# Patient Record
Sex: Male | Born: 1985 | Race: White | Hispanic: No | Marital: Single | Smoking: Never smoker
Health system: Southern US, Community
[De-identification: ages and names within clinical notes are randomized; demographics above are authoritative.]

---

## 2009-07-27 ENCOUNTER — Emergency Department (HOSPITAL_COMMUNITY): Admission: EM | Admit: 2009-07-27 | Discharge: 2009-07-27 | Payer: Self-pay | Admitting: Emergency Medicine

## 2010-03-04 IMAGING — CR DG TIBIA/FIBULA 2V*L*
4 series · 4 of 4 positions shown · non-contrast
Comparison: None available.

CLINICAL DATA: Penetrating trauma.  Laceration.

LEFT TIBIA AND FIBULA - 2 VIEW

[t tib/fib ap left (1 of 2)]
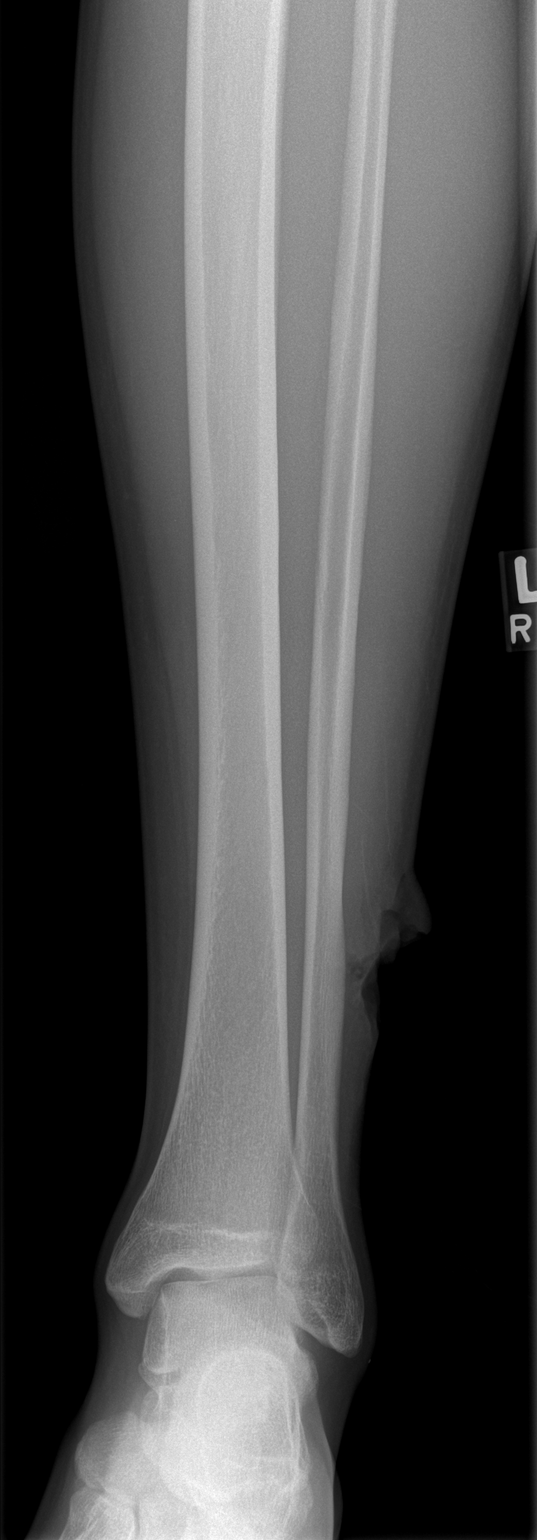

[t tib/fib ap left (2 of 2)]
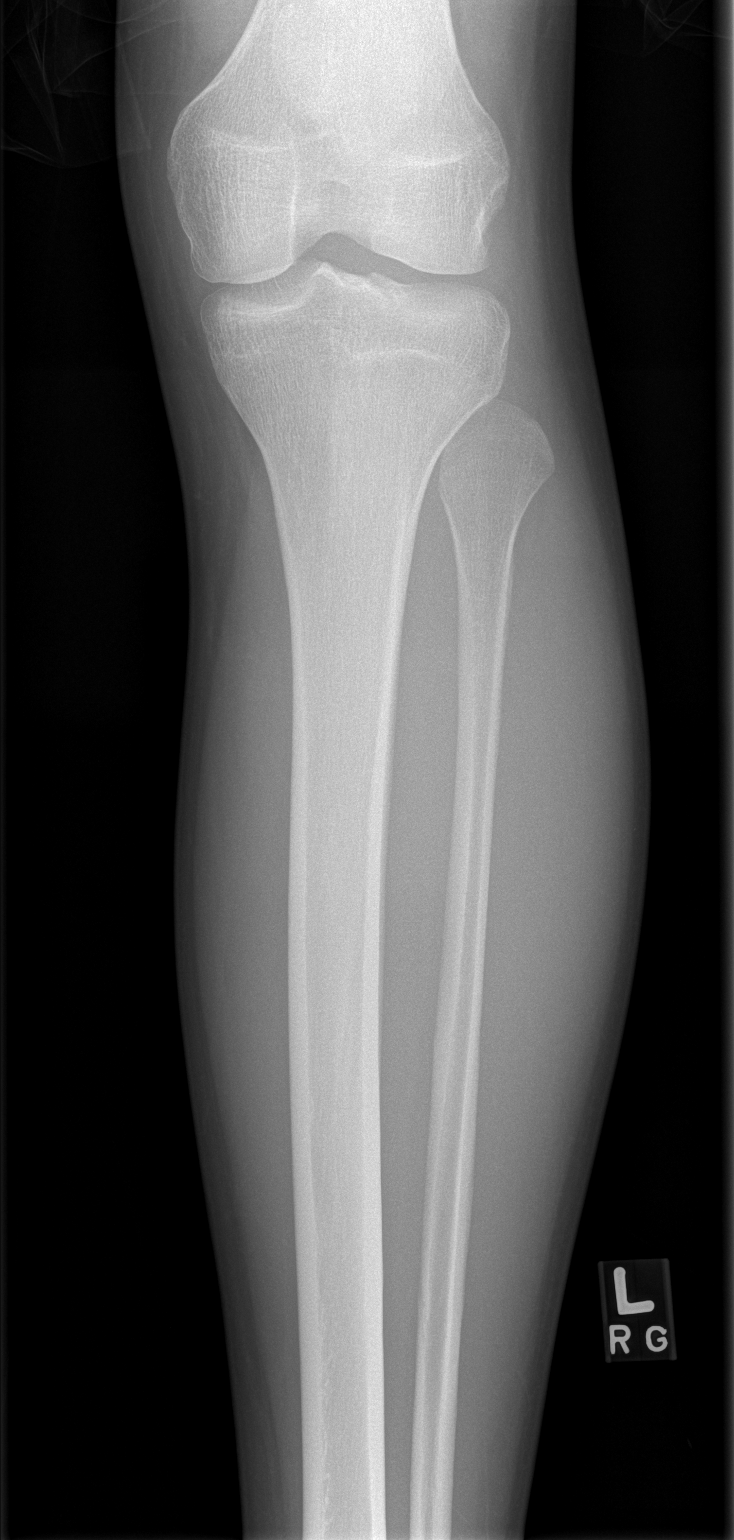

[t tib/fib lat left (1 of 2)]
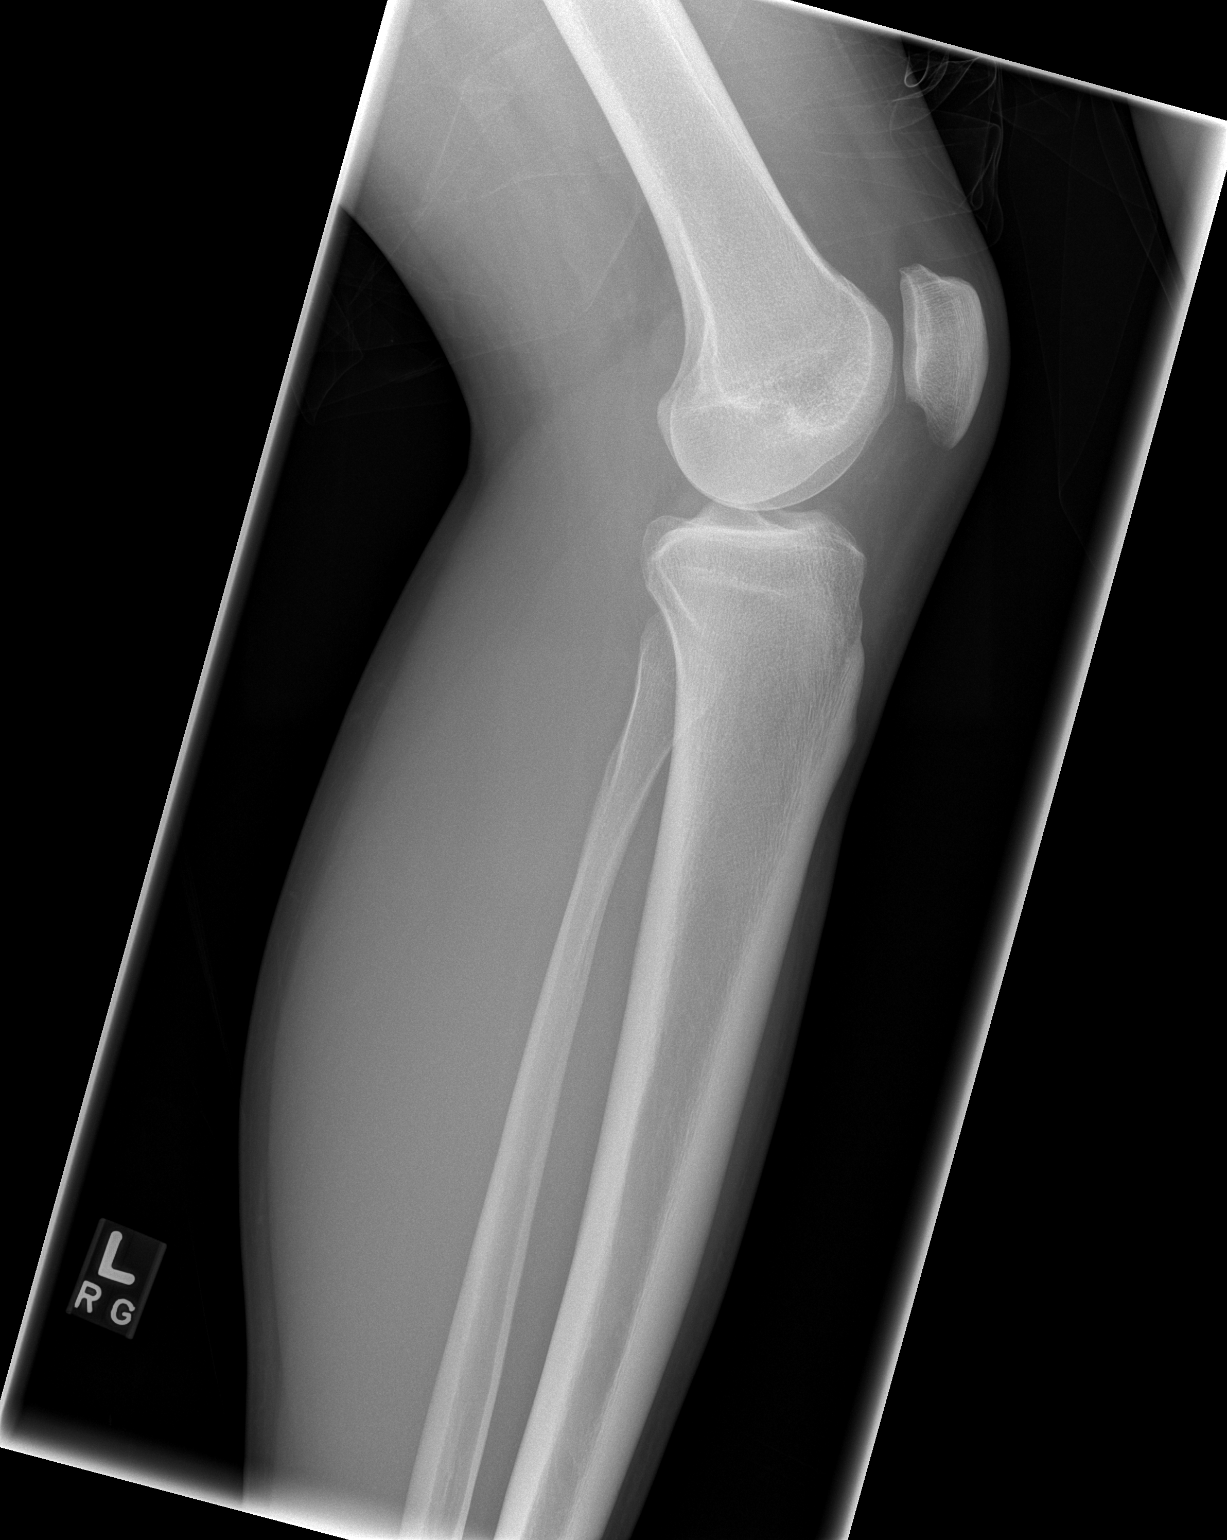

[t tib/fib lat left (2 of 2)]
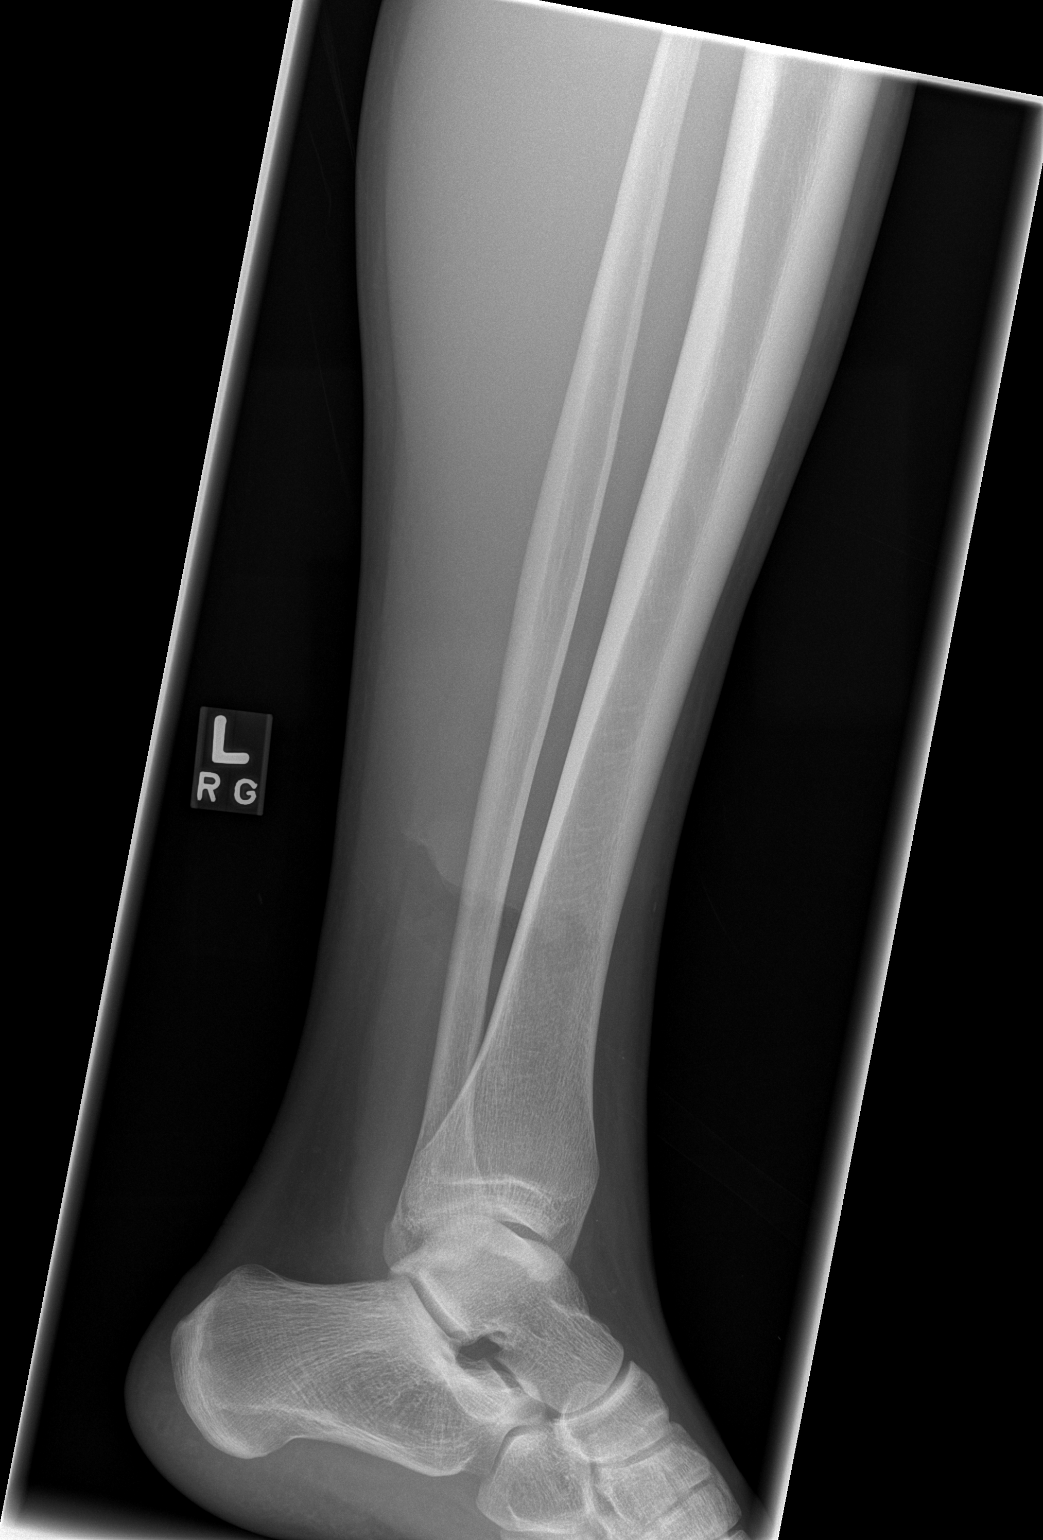

[4 of 4 positions shown; findings below may reference images not displayed]

FINDINGS: There is a soft tissue defect in the distal lateral
aspect of the left leg.  No fracture is identified.  No radiopaque
foreign body is seen.  The soft tissue defect extends deeply and
may reach the fibular cortical surface.  Otherwise exam is
unremarkable.
IMPRESSION: Laceration and soft tissue defect in the lateral distal leg.  No
fracture identified.

## 2011-11-27 ENCOUNTER — Ambulatory Visit (INDEPENDENT_AMBULATORY_CARE_PROVIDER_SITE_OTHER): Payer: No Typology Code available for payment source

## 2011-11-27 DIAGNOSIS — J209 Acute bronchitis, unspecified: Secondary | ICD-10-CM

## 2012-02-16 ENCOUNTER — Ambulatory Visit (HOSPITAL_COMMUNITY): Payer: Self-pay | Admitting: Psychiatry

## 2012-04-27 ENCOUNTER — Encounter (HOSPITAL_COMMUNITY): Payer: Self-pay | Admitting: Physician Assistant

## 2012-04-27 ENCOUNTER — Ambulatory Visit (INDEPENDENT_AMBULATORY_CARE_PROVIDER_SITE_OTHER): Payer: No Typology Code available for payment source | Admitting: Physician Assistant

## 2012-04-27 DIAGNOSIS — F330 Major depressive disorder, recurrent, mild: Secondary | ICD-10-CM | POA: Insufficient documentation

## 2012-04-27 NOTE — Progress Notes (Signed)
Psychiatric Assessment Adult  Patient Identification:  Joshua Oconnor Date of Evaluation:  04/27/2012 Chief Complaint: Depression and insomnia History of Chief Complaint:   Chief Complaint  Patient presents with  . Depression  . Establish Care    HPI Joshua Oconnor presents today for his initial evaluation and subsequent treatment of his depression and extreme sleep difficulties. He reports that this is a long-standing problem, that is causing problems for him due to his busy schedule of work and school.  Joshua Oconnor explains that his depression stems from his childhood when he developed low self-esteem and a poor self image. He continues to have frequent negative self talk, feels an adequate, and that his life has been a failure. He denies any suicidal or homicidal ideation. He denies any auditory or visual hallucinations, or paranoid delusions.  His sleep pattern is as thus: He works until 11:00 most nights, then when he gets home he is unable to unwind, and has a busy mind. He does homework or chores until he is able to go to sleep at 3 or 4 AM. The next morning he will often sleep through important events such as a class because he is unable to wake up. This problem is causing him to fail classes and to be put on academic notice at school. This then further supports his feelings of inadequacy.  He denies anxiety, but does endorse living and isolated life. He denies any period of time with a decreased need for sleep, or increased in mood or energy. He denies any impulsivity. He does endorse easy distractibility, trouble finishing projects, and procrastination. All they do he denies that he loses items or is a disorganized person, he reports others would disagree on that point. He denies any feelings of restlessness or fidgetiness. Review of Systems Physical Exam Joshua Oconnor is a well-nourished well-developed white male who presents as fully alert and oriented and in no acute distress. He is able to ambulate without  assistance, and appears superficially to be healthy and appropriate for his stated age of 76.  Depressive Symptoms: depressed mood, anhedonia, psychomotor agitation, feelings of worthlessness/guilt, hopelessness, disturbed sleep,  (Hypo) Manic Symptoms:   Elevated Mood:  No Irritable Mood:  No Grandiosity:  No Distractibility:  Yes Labiality of Mood:  No Delusions:  No Hallucinations:  No Impulsivity:  No Sexually Inappropriate Behavior:  No Financial Extravagance:  No Flight of Ideas:  No  Anxiety Symptoms: Excessive Worry:  No Panic Symptoms:  No Agoraphobia:  No Obsessive Compulsive: No  Symptoms: None, Specific Phobias:  No Social Anxiety:  No  Psychotic Symptoms:  Hallucinations: No None Delusions:  No Paranoia:  No   Ideas of Reference:  No  PTSD Symptoms: Ever had a traumatic exposure:  No Had a traumatic exposure in the last month:  No Re-experiencing: No None Hypervigilance:  No Hyperarousal: No None Avoidance: No None  Traumatic Brain Injury: No   Past Psychiatric History: Diagnosis: Depression   Hospitalizations: None   Outpatient Care: Previously with Dr. Toni Arthurs   Substance Abuse Care: None   Self-Mutilation: None   Suicidal Attempts: None   Violent Behaviors: None    Past Medical History:   Past Medical History  Diagnosis Date  . Asthma     Has not had in years   History of Loss of Consciousness:  No Seizure History:  No Cardiac History:  No Allergies:  No Known Allergies Current Medications:  No current outpatient prescriptions on file.    Previous Psychotropic Medications:  Medication  Dose   Paxil     Wellbutrin     Ambien    trazodone              Substance Abuse History in the last 12 months: No history of abuse in the past 12 months. Patient reports that he drinks 2-3 drinks once or twice monthly   Social History: Javyon was born in [place]. His father was in the [employer], so his family moved quite a bit while he was  growing up. He reports that he had a happy childhood. He states that his parents were conservative Christians, and he was rather sheltered. He denies suffering any abuse. 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 He currently lives alone. He has a girlfriend of 14 months. He reports that he is spiritual, but affiliates with no religion. He denies any legal difficulties. He reports that his social support system consists of his girlfriend and his parents, as well as friends who are coworkers.  Family History:   Family History  Problem Relation Age of Onset  . Depression Mother     Mental Status Examination/Evaluation: Objective:  Appearance: Casual and Well Groomed  Eye Contact::  Good  Speech:  Clear and Coherent  Volume:  Normal  Mood:  Mildly anxious and dysphoric   Affect:  Congruent  Thought Process:  Goal Directed and Linear  Orientation:  Full  Thought Content:  WDL  Suicidal Thoughts:  No  Homicidal Thoughts:  No  Judgement:  Good  Insight:  Good  Psychomotor Activity:  Normal  Akathisia:  No  Handed:  Right  AIMS (if indicated):    Assets:  Communication Skills Desire for Improvement Physical Health Resilience Transportation Vocational/Educational    Laboratory/X-Ray Psychological Evaluation(s)        Assessment:    AXIS I Major Depression, Recurrent mild, rule out ADHD   AXIS II Deferred  AXIS III Past Medical History  Diagnosis Date  . Asthma     Has not had in years     AXIS IV educational problems and occupational problems  AXIS V 51-60 moderate symptoms   Treatment Plan/Recommendations:  Plan of Care: We will first try her Remeron to treat his insomnia and depression. If significant improvement is not made we will consider a stimulant medication for ADHD.   Laboratory:    Psychotherapy: Will refer to [facility]   Medications: Remeron 7.5-15 mg at bedtime   Routine PRN Medications:   No  Consultations:   Safety Concerns:  None   Other:      Cheyan Frees, PA-C 5/14/20135:08 PM

## 2012-04-28 ENCOUNTER — Telehealth (HOSPITAL_COMMUNITY): Payer: Self-pay | Admitting: *Deleted

## 2012-04-28 MED ORDER — MIRTAZAPINE 15 MG PO TBDP: 15.0000 mg | ORAL_TABLET | Freq: Every day | ORAL | Status: DC

## 2012-04-28 NOTE — Telephone Encounter (Signed)
Left [id number] RX for med was to be sent to [facility] on [address] at end of appointment yesterday. They have no RX for him.

## 2012-06-03 ENCOUNTER — Ambulatory Visit (HOSPITAL_COMMUNITY): Payer: Self-pay | Admitting: Marriage and Family Therapist

## 2012-06-08 ENCOUNTER — Ambulatory Visit (HOSPITAL_COMMUNITY): Payer: Self-pay | Admitting: Physician Assistant

## 2012-07-15 ENCOUNTER — Ambulatory Visit (HOSPITAL_COMMUNITY): Payer: Self-pay | Admitting: Marriage and Family Therapist

## 2012-07-21 ENCOUNTER — Ambulatory Visit (INDEPENDENT_AMBULATORY_CARE_PROVIDER_SITE_OTHER): Payer: No Typology Code available for payment source | Admitting: Family Medicine

## 2012-07-21 VITALS — BP 104/65 | HR 83 | Temp 98.5°F | Resp 16 | Ht 73.5 in | Wt 168.0 lb

## 2012-07-21 DIAGNOSIS — R351 Nocturia: Secondary | ICD-10-CM

## 2012-07-21 DIAGNOSIS — J029 Acute pharyngitis, unspecified: Secondary | ICD-10-CM

## 2012-07-21 LAB — BASIC METABOLIC PANEL
BUN: 12 mg/dL (ref 6–23)
Calcium: 9.8 mg/dL (ref 8.4–10.5)
Creat: 1.17 mg/dL (ref 0.50–1.35)
Glucose, Bld: 78 mg/dL (ref 70–99)

## 2012-07-21 LAB — POCT URINALYSIS DIPSTICK
Blood, UA: NEGATIVE
Nitrite, UA: NEGATIVE
Urobilinogen, UA: 1
pH, UA: 6

## 2012-07-21 LAB — POCT UA - MICROSCOPIC ONLY
Casts, Ur, LPF, POC: NEGATIVE
Yeast, UA: NEGATIVE

## 2012-07-21 MED ORDER — PENICILLIN V POTASSIUM 500 MG PO TABS: 500.0000 mg | ORAL_TABLET | Freq: Three times a day (TID) | ORAL | Status: AC

## 2012-07-21 NOTE — Progress Notes (Signed)
[facility] [address], [place] [address] [phone]  Date:  07/21/2012   Name:  Joshua Oconnor   DOB:  1986-06-25   MRN:  [id number]  PCP:  No primary provider on file.    Chief Complaint: Back Pain, Fever, Generalized Body Aches, Urinary Frequency and Headache   History of Present Illness:  Joshua Oconnor is a 26 y.o. very pleasant male patient who presents with the following:  Feeing ill since monday- he felt tired.  The next day he noted a back ache, felt achy all over, noted frequency of urination.  He had sweats yesterday. No medication yet today.   He still has the back ache.   No hematuria- has noted a strange smell to his urine but no dysuria.  No prior histoyr of UTI.  No penile discharge.   Nausea but no vomiting.  Taking liquids ok.    He is usually healthy  Today also notes a ST  Patient Active Problem List  Diagnosis  . Major depressive disorder, recurrent episode, mild    Past Medical History  Diagnosis Date  . Asthma     Has not had in years    No past surgical history on file.  History  Substance Use Topics  . Smoking status: Never Smoker   . Smokeless tobacco: Not on file  . Alcohol Use: Yes     Once or twice a month, 2 or 3 drinks    Family History  Problem Relation Age of Onset  . Depression Mother     No Known Allergies  Medication list has been reviewed and updated.  Current Outpatient Prescriptions on File Prior to Visit  Medication Sig Dispense Refill  . mirtazapine (REMERON SOL-TAB) 15 MG disintegrating tablet Take 1 tablet (15 mg total) by mouth at bedtime.  30 tablet  1    Review of Systems:  As per HPI- otherwise negative.   Physical Examination: Filed Vitals:   07/21/12 1145  BP: 104/65  Pulse: 83  Temp: 98.5 F (36.9 C)  Resp: 16   Filed Vitals:   07/21/12 1145  Height: 6' 1.5" (1.867 m)  Weight: 168 lb (76.204 kg)   Body mass index is 21.86 kg/(m^2). Ideal Body Weight: Weight in (lb) to  have BMI = 25: 191.7   GEN: WDWN, NAD, Non-toxic, A & O x 3 HEENT: Atraumatic, Normocephalic. Neck supple. No masses, No LAD.  TM wnl, PEERL. Oropharynx with bilateral redness and exudate Ears and Nose: No external deformity. CV: RRR, No M/G/R. No JVD. No thrill. No extra heart sounds. PULM: CTA B, no wheezes, crackles, rhonchi. No retractions. No resp. distress. No accessory muscle use. ABD: S, NT, ND, +BS. No rebound. No HSM.  No CVA tenderness EXTR: No c/c/e NEURO Normal gait.  PSYCH: Normally interactive. Conversant. Not depressed or anxious appearing.  Calm demeanor.    Results for orders placed in visit on 07/21/12  POCT UA - MICROSCOPIC ONLY      Component Value Range   WBC, Ur, HPF, POC 1-3     RBC, urine, microscopic 0-2     Bacteria, U Microscopic small     Mucus, UA small     Epithelial cells, urine per micros 2-4     Crystals, Ur, HPF, POC neg     Casts, Ur, LPF, POC neg     Yeast, UA neg    POCT URINALYSIS DIPSTICK      Component Value Range  Color, UA dark amber     Clarity, UA clear     Glucose, UA neg     Bilirubin, UA mod     Ketones, UA 15     Spec Grav, UA 1.020     Blood, UA neg     pH, UA 6.0     Protein, UA trace     Urobilinogen, UA 1.0     Nitrite, UA neg     Leukocytes, UA Negative    POCT RAPID STREP A (OFFICE)      Component Value Range   Rapid Strep A Screen Positive (*) Negative    Assessment and Plan: 1. Frequent urination at night  POCT UA - Microscopic Only, POCT urinalysis dipstick, Basic metabolic panel  2. ST (sore throat)  POCT rapid strep A, penicillin v potassium (VEETID) 500 MG tablet   Strep throat- will treat as above.  Suspect that aches and back pain are relate to strep.  Will check BMP to evaluate his kidney function and follow- up further with results.   Patient (or parent if minor) instructed to return to clinic or call if not better in 2 day(s). Sooner if worse.     Abbe Amsterdam, MD

## 2012-12-16 ENCOUNTER — Ambulatory Visit (INDEPENDENT_AMBULATORY_CARE_PROVIDER_SITE_OTHER): Payer: PRIVATE HEALTH INSURANCE | Admitting: Family Medicine

## 2012-12-16 VITALS — BP 110/75 | HR 67 | Temp 98.2°F | Resp 16 | Ht 73.5 in | Wt 159.0 lb

## 2012-12-16 DIAGNOSIS — J019 Acute sinusitis, unspecified: Secondary | ICD-10-CM

## 2012-12-16 DIAGNOSIS — R059 Cough, unspecified: Secondary | ICD-10-CM

## 2012-12-16 DIAGNOSIS — R05 Cough: Secondary | ICD-10-CM

## 2012-12-16 MED ORDER — AMOXICILLIN 875 MG PO TABS: 875.0000 mg | ORAL_TABLET | Freq: Two times a day (BID) | ORAL | Status: DC

## 2012-12-16 NOTE — Patient Instructions (Signed)

## 2012-12-16 NOTE — Progress Notes (Signed)
[facility]:  Office Visit  Chief Complaint:  Chief Complaint  Patient presents with  . Cough    x 12/31  . Generalized Body Aches    x 12/31  . Chills    x 12/31    HPI: Joshua Oconnor is a 27 y.o. male who complains of  Chills, sinus congestion, coughs, aches and pain x 3 days. Took 3 doses of PCN. Mucinex for cough. Feels better. Has had flu before and does not feel like it. Denies any recent  asthma, allergies, tobacco use.   Past Medical History  Diagnosis Date  . Asthma     Has not had in years   No past surgical history on file. History   Social History  . Marital Status: Single    Spouse Name: N/A    Number of Children: N/A  . Years of Education: N/A   Occupational History  . Server    Social History Main Topics  . Smoking status: Never Smoker   . Smokeless tobacco: None  . Alcohol Use: Yes     Comment: Once or twice a month, 2 or 3 drinks  . Drug Use: Yes    Special: Marijuana     Comment: rarely  . Sexually Active: Yes -- Male partner(s)   Other Topics Concern  . None   Social History Narrative   Joshua Oconnor was born in [place]. His father was in the Army, so his family moved quite a bit while he was growing up. He reports that he had a happy childhood. He states that his parents were conservative Christians, and he was rather sheltered. He denies suffering any abuse. 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 He currently lives alone. He has a girlfriend of 14 months. He reports that he is spiritual, but affiliates with no religion. He denies any legal difficulties. He reports that his social support system consists of his girlfriend and his parents, as well as friends who are coworkers.   Family History  Problem Relation Age of Onset  . Depression Mother    No Known Allergies Prior to Admission medications   Medication Sig Start Date End Date Taking? Authorizing Provider    mirtazapine (REMERON SOL-TAB) 15 MG disintegrating tablet Take 1 tablet (15 mg total) by mouth at bedtime. 04/27/12 04/27/13  Jorje Guild, PA-C     ROS: The patient denies fevers, chills, night sweats, unintentional weight loss, chest pain, palpitations, wheezing, dyspnea on exertion, nausea, vomiting, abdominal pain, dysuria, hematuria, melena, numbness, weakness, or tingling.  All other systems have been reviewed and were otherwise negative with the exception of those mentioned in the HPI and as above.    PHYSICAL EXAM: Filed Vitals:   12/16/12 1537  BP: 110/75  Pulse: 67  Temp: 98.2 F (36.8 C)  Resp: 16   Filed Vitals:   12/16/12 1537  Height: 6' 1.5" (1.867 m)  Weight: 159 lb (72.122 kg)   Body mass index is 20.69 kg/(m^2).  General: Alert, no acute distress HEENT:  Normocephalic, atraumatic, oropharynx patent. TM nl, + min sinus tenderness frontal Cardiovascular:  Regular rate and rhythm, no rubs murmurs or gallops.  No Carotid bruits, radial pulse intact. No pedal edema.  Respiratory: Clear to auscultation bilaterally.  No wheezes, rales, or rhonchi.  No cyanosis, no use of accessory musculature GI: No organomegaly, abdomen is soft and non-tender, positive bowel sounds.  No masses. Skin: No rashes.  Neurologic: Facial musculature symmetric. Psychiatric: Patient is appropriate throughout our interaction. Lymphatic: No cervical lymphadenopathy Musculoskeletal: Gait intact.   LABS: Results for orders placed in visit on 07/21/12  POCT UA - MICROSCOPIC ONLY      Component Value Range   WBC, Ur, HPF, POC 1-3     RBC, urine, microscopic 0-2     Bacteria, U Microscopic small     Mucus, UA small     Epithelial cells, urine per micros 2-4     Crystals, Ur, HPF, POC neg     Casts, Ur, LPF, POC neg     Yeast, UA neg    POCT URINALYSIS DIPSTICK      Component Value Range   Color, UA dark amber     Clarity, UA clear     Glucose, UA neg     Bilirubin, UA mod     Ketones, UA  15     Spec Grav, UA 1.020     Blood, UA neg     pH, UA 6.0     Protein, UA trace     Urobilinogen, UA 1.0     Nitrite, UA neg     Leukocytes, UA Negative    POCT RAPID STREP A (OFFICE)      Component Value Range   Rapid Strep A Screen Positive (*) Negative  BASIC METABOLIC PANEL      Component Value Range   Sodium 139  135 - 145 mEq/L   Potassium 4.4  3.5 - 5.3 mEq/L   Chloride 99  96 - 112 mEq/L   CO2 29  19 - 32 mEq/L   Glucose, Bld 78  70 - 99 mg/dL   BUN 12  6 - 23 mg/dL   Creat 1.61  0.96 - 0.45 mg/dL   Calcium 9.8  8.4 - 40.9 mg/dL     EKG/XRAY:   Primary read interpreted by Dr. Conley Rolls at [facility].   ASSESSMENT/PLAN: Encounter Diagnoses  Name Primary?  . Acute sinusitis Yes  . Cough    Rx Amoxacillin BID x 10 days Take otc cough medicine F/u prn    Joshua Kataoka PHUONG, DO 12/16/2012 4:03 PM

## 2014-05-18 ENCOUNTER — Ambulatory Visit (INDEPENDENT_AMBULATORY_CARE_PROVIDER_SITE_OTHER): Payer: BC Managed Care – PPO | Admitting: Family Medicine

## 2014-05-18 VITALS — BP 100/68 | HR 62 | Temp 97.7°F | Resp 16 | Ht 72.0 in | Wt 168.6 lb

## 2014-05-18 DIAGNOSIS — Z7189 Other specified counseling: Secondary | ICD-10-CM

## 2014-05-18 DIAGNOSIS — Z7184 Encounter for health counseling related to travel: Secondary | ICD-10-CM

## 2014-05-18 DIAGNOSIS — L723 Sebaceous cyst: Secondary | ICD-10-CM

## 2014-05-18 DIAGNOSIS — L720 Epidermal cyst: Secondary | ICD-10-CM

## 2014-05-18 MED ORDER — CIPROFLOXACIN HCL 500 MG PO TABS: 500.0000 mg | ORAL_TABLET | Freq: Two times a day (BID) | ORAL | Status: AC

## 2014-05-18 MED ORDER — TYPHOID VACCINE PO CPDR: 1.0000 | DELAYED_RELEASE_CAPSULE | ORAL | Status: AC

## 2014-05-18 MED ORDER — ACETAZOLAMIDE 125 MG PO TABS: 250.0000 mg | ORAL_TABLET | Freq: Two times a day (BID) | ORAL | Status: AC

## 2014-05-18 NOTE — Patient Instructions (Addendum)
For the diamox - start 1 tab twice a day 24 hours before or on day of ascent and continue either until 2-3 days after peak arrival or upon descent for prevention of altitude sickness.  If you develop symptoms of altitude sickness, increase dose to 2 tabs twice a day. Go to [facility] to get your yellow fever vaccine. If you decide you want malaria prophylaxis medicine, you may call. If you develop diarrhea with fever and chills >1-2d start cipro twice a day for 5 days. I do not know if the rabies vaccine series would be worth your time and money but ask about this at the [facility]. I do think you may benefit from the typhoid vaccine - the [facility] may have the injectable vaccine and you can do this if it is less expensive. However, if not, you can fill the prescription for the oral typhoid vaccine today (usually private local pharmacies carry it like [facility] For the oral typhoid vaccine, take 1 pill every other day until 4 doses are complete. You need to complete all 4 doses 1 week prior to potential exposure for adequate coverage. Take the pill 1 hour before meals with cold or lukewarm drink. This should cover your for about 5 years.   appt at [facility] for yellow fever vaccine (and possibly typhoid vaacine and consider rabies vaccine) is today at 1:15.  Altitude Sickness in Sports When someone who is not used to being at high altitudes (usually 8000 ft or higher) travels to high altitude, he or she may experience altitude sickness (mountain sickness). Symptoms are often present within 12 to 36 hours of ascent. Altitude sickness occurs in 25% of people traveling to high altitude. It usually goes away 1 to 2 days after staying at the same altitude. At high altitudes, the air contains less oxygen than at lower altitudes. The decrease in oxygen requires your body to work harder, in order to get the  oxygen it needs. Anyone may get altitude sickness, despite their level of physical fitness. RISK INCREASES WITH:  Dehydration.  Chronic lung diseases (asthma, COPD).  Diabetes.  Heart disease.  Not acclimating to elevation change.  Smoking.  Alcohol use.  Anemia (not enough red blood cells). SYMPTOMS   Flu-like symptoms.  Loss of appetite.  Difficulty breathing.  Trouble sleeping.  Headache.  Nausea.  Confusion.  Fatigue.  Vomiting.  Disorientation.  Shortness of breath.  Weakness.  Trouble seeing clearly.  Loss of balance.  Coughing up pink-colored phlegm. PREVENTION   Ascend to higher elevations slowly. Allow the body to adjust (acclimate).  Wait 2 to 3 days before starting physical activity at high altitudes.  Stay hydrated.  Avoid drinking alcohol, coffee, or tea.  Avoid smoking.  Avoid the use of sleeping pills.  Avoid medicines that are diuretics (make you urinate frequently). If you have a prescription for such medicine, ask your caregiver before discontinuing the medicine.  If you have experienced altitude sickness in the past, your caregiver may be able to prescribe a medicine for you that prevents altitude sickness.  If prescribed, take medicine before ascending to high altitude and continue taking the medicine throughout your stay. RELATED COMPLICATIONS   Fluid buildup in the lungs (pulmonary edema).  Swelling of the brain (cerebral edema).  Death. TREATMENT The most definitive treatment for altitude sickness is returning to a lower elevation. Other treatments include giving oxygen for 12 to 24  hours and fluid consumption. You may be prescribed medicines such as acetazolamide (Diamox) by a caregiver. If symptoms go away after returning to a lower altitude, you may try returning to a higher elevation only after your body adjusts, which may take 1 to 3 days. High-altitude edema (pulmonary edema or cerebral edema) is a very serious  condition that can be fatal. If you are experiencing edema, do not return to higher altitude. Document Released: 12/01/2005 Document Revised: 02/23/2012 Document Reviewed: 03/15/2009 [facility]ExitCare Patient Information 2014 [employer], [place]. Epidermal Cyst An epidermal cyst is sometimes called a sebaceous cyst, epidermal inclusion cyst, or infundibular cyst. These cysts usually contain a substance that looks "pasty" or "cheesy" and may have a bad smell. This substance is a protein called keratin. Epidermal cysts are usually found on the face, neck, or trunk. They may also occur in the vaginal area or other parts of the genitalia of both men and women. Epidermal cysts are usually small, painless, slow-growing bumps or lumps that move freely under the skin. It is important not to try to pop them. This may cause an infection and lead to tenderness and swelling. CAUSES  Epidermal cysts may be caused by a deep penetrating injury to the skin or a plugged hair follicle, often associated with acne. SYMPTOMS  Epidermal cysts can become inflamed and cause:  Redness.  Tenderness.  Increased temperature of the skin over the bumps or lumps.  Grayish-white, bad smelling material that drains from the bump or lump. DIAGNOSIS  Epidermal cysts are easily diagnosed by your caregiver during an exam. Rarely, a tissue sample (biopsy) may be taken to rule out other conditions that may resemble epidermal cysts. TREATMENT   Epidermal cysts often get better and disappear on their own. They are rarely ever cancerous.  If a cyst becomes infected, it may become inflamed and tender. This may require opening and draining the cyst. Treatment with antibiotics may be necessary. When the infection is gone, the cyst may be removed with minor surgery.  Small, inflamed cysts can often be treated with antibiotics or by injecting steroid medicines.  Sometimes, epidermal cysts become large and bothersome. If this happens, surgical  removal in your caregiver's office may be necessary. HOME CARE INSTRUCTIONS  Only take over-the-counter or prescription medicines as directed by your caregiver.  Take your antibiotics as directed. Finish them even if you start to feel better. SEEK MEDICAL CARE IF:   Your cyst becomes tender, red, or swollen.  Your condition is not improving or is getting worse.  You have any other questions or concerns. MAKE SURE YOU:  Understand these instructions.  Will watch your condition.  Will get help right away if you are not doing well or get worse. Document Released: 11/01/2004 Document Revised: 02/23/2012 Document Reviewed: 06/09/2011 [facility] Patient Information 2014 [place], [place].

## 2014-05-18 NOTE — Progress Notes (Signed)
This chart was scribed for Norberto Sorenson, MD by Ardelia Mems, Scribe. This patient was seen in room 14 and the patient's care was started at 10:35 AM.  Subjective:    Patient ID: Joshua Oconnor, male    DOB: December 03, 1986, 28 y.o.   MRN: [id number]  Chief Complaint  Patient presents with  . Immunizations    Rx for Yellow fever  . Altitude Sickness    Requesting Rx  . Mass    Bump - Chest Left side x 1 yr Requesting removal if possible`    HPI  HPI Comments: Joshua Oconnor is a 28 y.o. male who presents to [facility] requesting a prescription for altitude sickness. He states that he is leaving on 7/13 to go to [place] for 6 months. He states that he has never taken prescription medications for altitude sickness. He states that he is planning on hiking in the [place], which is why he believes he needs to start on Diamox as a prophylactic.  He also states that he will be going to [place], and that they will require that he be vaccinated against Yellow Fever. He is requesting information on where he can get this vaccination.  He is also wanting to receive an antibiotic prescription to take in the event that he gets traveler's diarrhea or any other bacterial illness while abroad.  He also states that he has had a mass on his left chest for years. He states that he has been told the area is benign. He states that he has never noticed drainage from the area. He is wanting to know if he should have the area removed.     Past Medical History  Diagnosis Date  . Asthma     Has not had in years   No current outpatient prescriptions on file prior to visit.   No current facility-administered medications on file prior to visit.   No Known Allergies  Review of Systems  Constitutional: Negative for diaphoresis, activity change, appetite change and fatigue.  Respiratory: Negative for cough and shortness of breath.   Cardiovascular: Negative for chest pain.  Gastrointestinal:  Negative for abdominal pain.  Musculoskeletal: Negative for back pain and neck pain.  Skin: Negative for rash.       Mass on left chest  Psychiatric/Behavioral: Negative for confusion.      BP 100/68  Pulse 62  Temp(Src) 97.7 F (36.5 C) (Oral)  Resp 16  Ht 6' (1.829 m)  Wt 168 lb 9.6 oz (76.476 kg)  BMI 22.86 kg/m2  SpO2 100% Objective:   Physical Exam  Nursing note and vitals reviewed. Constitutional: He is oriented to person, place, and time. He appears well-developed and well-nourished. No distress.  HENT:  Head: Normocephalic and atraumatic.  Eyes: Conjunctivae and EOM are normal.  Neck: Normal range of motion. No tracheal deviation present.  Cardiovascular: Normal rate.   Pulmonary/Chest: Effort normal. No respiratory distress.  Musculoskeletal: Normal range of motion.  Neurological: He is alert and oriented to person, place, and time.  Skin: Skin is warm and dry.  Psychiatric: He has a normal mood and affect. His behavior is normal.       Assessment & Plan:   Travel advice encounter - f/u w/ [facility] employee health for vaccinations  Epidermal inclusion cyst - watchful waiting, pt reassured  Meds ordered this encounter  Medications  . ciprofloxacin (CIPRO) 500 MG tablet    Sig: Take 1 tablet (500 mg total) by mouth  2 (two) times daily.    Dispense:  20 tablet    Refill:  0  . acetaZOLAMIDE (DIAMOX) 125 MG tablet    Sig: Take 2 tablets (250 mg total) by mouth 2 (two) times daily.    Dispense:  120 tablet    Refill:  1  . typhoid (VIVOTIF BERNA VACCINE) DR capsule    Sig: Take 1 capsule by mouth every other day.    Dispense:  4 capsule    Refill:  0    I personally performed the services described in this documentation, which was scribed in my presence. The recorded information has been reviewed and considered, and addended by me as needed.  Norberto SorensonEva Shaw, MD MPH
